# Patient Record
Sex: Female | Born: 1960 | Race: Black or African American | Hispanic: No | Marital: Married | Smoking: Never smoker
Health system: Southern US, Community
[De-identification: ages and names within clinical notes are randomized; demographics above are authoritative.]

---

## 2017-11-22 ENCOUNTER — Other Ambulatory Visit: Payer: Self-pay

## 2017-11-22 ENCOUNTER — Emergency Department: Payer: No Typology Code available for payment source

## 2017-11-22 ENCOUNTER — Encounter: Payer: Self-pay | Admitting: Emergency Medicine

## 2017-11-22 DIAGNOSIS — S0990XA Unspecified injury of head, initial encounter: Secondary | ICD-10-CM | POA: Diagnosis not present

## 2017-11-22 DIAGNOSIS — R1011 Right upper quadrant pain: Secondary | ICD-10-CM | POA: Insufficient documentation

## 2017-11-22 DIAGNOSIS — S99922A Unspecified injury of left foot, initial encounter: Secondary | ICD-10-CM | POA: Diagnosis present

## 2017-11-22 DIAGNOSIS — Y999 Unspecified external cause status: Secondary | ICD-10-CM | POA: Insufficient documentation

## 2017-11-22 DIAGNOSIS — Y9389 Activity, other specified: Secondary | ICD-10-CM | POA: Diagnosis not present

## 2017-11-22 DIAGNOSIS — S92302A Fracture of unspecified metatarsal bone(s), left foot, initial encounter for closed fracture: Secondary | ICD-10-CM | POA: Diagnosis not present

## 2017-11-22 DIAGNOSIS — Y92410 Unspecified street and highway as the place of occurrence of the external cause: Secondary | ICD-10-CM | POA: Diagnosis not present
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
## 2017-11-23 ENCOUNTER — Encounter: Payer: Self-pay | Admitting: Radiology

## 2017-11-23 ENCOUNTER — Emergency Department: Payer: No Typology Code available for payment source

## 2017-11-23 ENCOUNTER — Emergency Department
Admission: EM | Admit: 2017-11-23 | Discharge: 2017-11-23 | Disposition: A | Discharge status: Home / Self Care | Payer: No Typology Code available for payment source | Attending: Student in an Organized Health Care Education/Training Program | Admitting: Student in an Organized Health Care Education/Training Program

## 2017-11-23 DIAGNOSIS — S92302A Fracture of unspecified metatarsal bone(s), left foot, initial encounter for closed fracture: Secondary | ICD-10-CM

## 2017-11-23 LAB — COMPREHENSIVE METABOLIC PANEL
ALBUMIN: 4 g/dL (ref 3.5–5.0)
ALT: 20 U/L (ref 0–44)
ANION GAP: 6 (ref 5–15)
AST: 23 U/L (ref 15–41)
Alkaline Phosphatase: 62 U/L (ref 38–126)
BILIRUBIN TOTAL: 0.5 mg/dL (ref 0.3–1.2)
BUN: 11 mg/dL (ref 6–20)
CO2: 26 mmol/L (ref 22–32)
Calcium: 8.9 mg/dL (ref 8.9–10.3)
Chloride: 106 mmol/L (ref 98–111)
Creatinine, Ser: 0.67 mg/dL (ref 0.44–1.00)
GFR calc Af Amer: >60 mL/min (ref >60)
GLUCOSE: 99 mg/dL (ref 70–99)
Potassium: 3.8 mmol/L (ref 3.5–5.1)
Sodium: 138 mmol/L (ref 135–145)
TOTAL PROTEIN: 7.4 g/dL (ref 6.5–8.1)

## 2017-11-23 LAB — CBC WITH DIFFERENTIAL/PLATELET
Abs Immature Granulocytes: 0.02 10*3/uL (ref 0.00–0.07)
Basophils Absolute: 0 10*3/uL (ref 0.0–0.1)
Basophils Relative: 0 %
EOS ABS: 0 10*3/uL (ref 0.0–0.5)
EOS PCT: 0 %
HEMATOCRIT: 41.6 % (ref 36.0–46.0)
HEMOGLOBIN: 13.4 g/dL (ref 12.0–15.0)
Immature Granulocytes: 0 %
LYMPHS PCT: 20 %
Lymphs Abs: 1.4 10*3/uL (ref 0.7–4.0)
MCH: 28.4 pg (ref 26.0–34.0)
MCHC: 32.2 g/dL (ref 30.0–36.0)
MCV: 88.1 fL (ref 80.0–100.0)
MONO ABS: 0.4 10*3/uL (ref 0.1–1.0)
MONOS PCT: 6 %
Neutro Abs: 5.1 10*3/uL (ref 1.7–7.7)
Neutrophils Relative %: 74 %
Platelets: 166 10*3/uL (ref 150–400)
RBC: 4.72 MIL/uL (ref 3.87–5.11)
RDW: 13 % (ref 11.5–15.5)
WBC: 7 10*3/uL (ref 4.0–10.5)
nRBC: 0 % (ref 0.0–0.2)

## 2017-11-23 MED ORDER — HYDROCODONE-ACETAMINOPHEN 5-325 MG PO TABS
1.0000 | ORAL_TABLET | Freq: Once | ORAL | Status: AC
  Administered 2017-11-23: 1 via ORAL

## 2017-11-23 MED ORDER — HYDROCODONE-ACETAMINOPHEN 5-325 MG PO TABS
ORAL_TABLET | ORAL | Status: AC
  Administered 2017-11-23: 1 via ORAL
  Filled 2017-11-23: qty 1

## 2017-11-23 MED ORDER — CYCLOBENZAPRINE HCL 5 MG PO TABS: 5.0000 mg | ORAL_TABLET | Freq: Three times a day (TID) | ORAL | 0 refills | Status: AC | PRN

## 2017-11-23 MED ORDER — HYDROCODONE-ACETAMINOPHEN 5-325 MG PO TABS: 1.0000 | ORAL_TABLET | ORAL | 0 refills | Status: AC | PRN

## 2017-11-23 MED ORDER — IOPAMIDOL (ISOVUE-300) INJECTION 61%
100.0000 mL | Freq: Once | INTRAVENOUS | Status: AC | PRN
  Administered 2017-11-23: 100 mL via INTRAVENOUS

## 2017-11-23 MED ORDER — HYDROCODONE-ACETAMINOPHEN 5-325 MG PO TABS
1.0000 | ORAL_TABLET | Freq: Once | ORAL | Status: AC
  Administered 2017-11-23: 1 via ORAL
  Filled 2017-11-23: qty 1
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
## 2017-11-23 NOTE — Discharge Instructions (Addendum)
Directions Share Save     [facility]  Podiatrist Address: [address] [place], [place] [address] Phone: [phone] Website: [email or web address]

## 2017-11-23 NOTE — ED Notes (Signed)
This RN reviewed discharge instructions, follow-up care, prescriptions, splint care, crutch use, cryotherapy, and need for elevation with patient. Patient verbalized understanding of all reviewed information.  Patient stable, with no distress noted at this time. 
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
